# Patient Record
Sex: Male | Born: 1970 | Race: White | Hispanic: No | Marital: Married | Smoking: Never smoker
Health system: Southern US, Community
[De-identification: ages and names within clinical notes are randomized; demographics above are authoritative.]

## PROBLEM LIST (undated history)

## (undated) HISTORY — PX: ELBOW SURGERY: SHX618

## (undated) HISTORY — PX: SHOULDER SURGERY: SHX246

---

## 2001-09-17 ENCOUNTER — Other Ambulatory Visit: Admission: RE | Admit: 2001-09-17 | Discharge: 2001-09-17 | Payer: Self-pay | Admitting: Internal Medicine

## 2005-10-02 ENCOUNTER — Emergency Department (HOSPITAL_COMMUNITY): Admission: EM | Admit: 2005-10-02 | Discharge: 2005-10-03 | Payer: Self-pay | Admitting: Emergency Medicine

## 2006-04-09 ENCOUNTER — Emergency Department (HOSPITAL_COMMUNITY): Admission: EM | Admit: 2006-04-09 | Discharge: 2006-04-09 | Payer: Self-pay | Admitting: Emergency Medicine

## 2006-11-02 ENCOUNTER — Ambulatory Visit (HOSPITAL_COMMUNITY): Admission: RE | Admit: 2006-11-02 | Discharge: 2006-11-02 | Payer: Self-pay | Admitting: Unknown Physician Specialty

## 2011-04-11 ENCOUNTER — Other Ambulatory Visit (HOSPITAL_COMMUNITY): Payer: Self-pay | Admitting: Urology

## 2011-04-11 DIAGNOSIS — R109 Unspecified abdominal pain: Secondary | ICD-10-CM

## 2011-04-15 ENCOUNTER — Ambulatory Visit (HOSPITAL_COMMUNITY)
Admission: RE | Admit: 2011-04-15 | Discharge: 2011-04-15 | Disposition: A | Discharge status: Home / Self Care | Payer: BC Managed Care – PPO | Source: Ambulatory Visit | Attending: Urology | Admitting: Urology

## 2011-04-15 DIAGNOSIS — R1032 Left lower quadrant pain: Secondary | ICD-10-CM | POA: Insufficient documentation

## 2011-04-15 DIAGNOSIS — R109 Unspecified abdominal pain: Secondary | ICD-10-CM

## 2014-03-27 ENCOUNTER — Encounter (HOSPITAL_COMMUNITY): Payer: Self-pay | Admitting: Emergency Medicine

## 2014-03-27 ENCOUNTER — Emergency Department (HOSPITAL_COMMUNITY): Payer: Managed Care, Other (non HMO)

## 2014-03-27 ENCOUNTER — Emergency Department (HOSPITAL_COMMUNITY)
Admission: EM | Admit: 2014-03-27 | Discharge: 2014-03-27 | Disposition: A | Discharge status: Home / Self Care | Payer: Managed Care, Other (non HMO) | Attending: Emergency Medicine | Admitting: Emergency Medicine

## 2014-03-27 DIAGNOSIS — IMO0002 Reserved for concepts with insufficient information to code with codable children: Secondary | ICD-10-CM | POA: Insufficient documentation

## 2014-03-27 DIAGNOSIS — Z880 Allergy status to penicillin: Secondary | ICD-10-CM | POA: Insufficient documentation

## 2014-03-27 DIAGNOSIS — S61209A Unspecified open wound of unspecified finger without damage to nail, initial encounter: Secondary | ICD-10-CM | POA: Insufficient documentation

## 2014-03-27 DIAGNOSIS — S61219A Laceration without foreign body of unspecified finger without damage to nail, initial encounter: Secondary | ICD-10-CM

## 2014-03-27 DIAGNOSIS — Z23 Encounter for immunization: Secondary | ICD-10-CM | POA: Diagnosis not present

## 2014-03-27 DIAGNOSIS — Y9389 Activity, other specified: Secondary | ICD-10-CM | POA: Diagnosis not present

## 2014-03-27 DIAGNOSIS — Y9289 Other specified places as the place of occurrence of the external cause: Secondary | ICD-10-CM | POA: Insufficient documentation

## 2014-03-27 DIAGNOSIS — S62609B Fracture of unspecified phalanx of unspecified finger, initial encounter for open fracture: Secondary | ICD-10-CM

## 2014-03-27 MED ORDER — LIDOCAINE HCL (PF) 1 % IJ SOLN
INTRAMUSCULAR | Status: AC
  Administered 2014-03-27: 19:00:00
  Filled 2014-03-27: qty 5

## 2014-03-27 MED ORDER — POVIDONE-IODINE 10 % EX SOLN
CUTANEOUS | Status: AC
  Administered 2014-03-27: 19:00:00
  Filled 2014-03-27: qty 118

## 2014-03-27 MED ORDER — BUPIVACAINE HCL (PF) 0.25 % IJ SOLN
10.0000 mL | Freq: Once | INTRAMUSCULAR | Status: AC
  Administered 2014-03-27: 10 mL
  Filled 2014-03-27: qty 30

## 2014-03-27 MED ORDER — TETANUS-DIPHTH-ACELL PERTUSSIS 5-2.5-18.5 LF-MCG/0.5 IM SUSP
0.5000 mL | Freq: Once | INTRAMUSCULAR | Status: AC
  Administered 2014-03-27: 0.5 mL via INTRAMUSCULAR
  Filled 2014-03-27: qty 0.5

## 2014-03-27 MED ORDER — DOUBLE ANTIBIOTIC 500-10000 UNIT/GM EX OINT
TOPICAL_OINTMENT | Freq: Once | CUTANEOUS | Status: AC
  Administered 2014-03-27: 1 via TOPICAL
  Filled 2014-03-27: qty 1

## 2014-03-27 MED ORDER — NAPROXEN 500 MG PO TABS: 500.0000 mg | ORAL_TABLET | Freq: Two times a day (BID) | ORAL | Status: AC

## 2014-03-27 MED ORDER — LIDOCAINE HCL (PF) 1 % IJ SOLN
5.0000 mL | Freq: Once | INTRAMUSCULAR | Status: AC
  Administered 2014-03-27: 5 mL
  Filled 2014-03-27: qty 5

## 2014-03-27 MED ORDER — CEPHALEXIN 500 MG PO CAPS: 500.0000 mg | ORAL_CAPSULE | Freq: Four times a day (QID) | ORAL | Status: AC

## 2014-03-27 MED ORDER — OXYCODONE-ACETAMINOPHEN 5-325 MG PO TABS: 1.0000 | ORAL_TABLET | ORAL | Status: AC | PRN

## 2014-03-27 NOTE — ED Notes (Signed)
Pt soaking finger wound in mixture of saline and betadine per request of Kerrie Buffalo, FNP

## 2014-03-27 NOTE — Discharge Instructions (Signed)
Call Dr. Sanjuan Dame office tomorrow morning to see what time they want to see you in the afternoon.

## 2014-03-27 NOTE — ED Provider Notes (Signed)
CSN: [id number]     Arrival date & time 03/27/14  1546 History   First MD Initiated Contact with Patient 03/27/14 1704   This chart was scribed for non-physician practitioner Kerrie Buffalo, NP, working with Hurman Horn, MD by Gwenevere Abbot, ED scribe. This patient was seen in room [facility]24[facility] and the patient's care was started at 5:15 PM.    Chief Complaint  Patient presents with  . Laceration   Patient is a 43 y.o. male presenting with skin laceration. The history is provided by the patient. No language interpreter was used.  Laceration Location:  Hand Hand laceration location:  R finger Injury mechanism: door. Pain details:    Quality:  Numbness Foreign body present:  No foreign bodies  HPI Comments:  Timothy Chang is a 43 y.o. male who presents to the Emergency Department complaining of an injury to the middle right finger after accidentally shutting it in a door. Pt reports that he has a laceration and he is experiencing decreased sensation and ROM.   History reviewed. No pertinent past medical history. Past Surgical History  Procedure Laterality Date  . Elbow surgery    . Shoulder surgery     History reviewed. No pertinent family history. History  Substance Use Topics  . Smoking status: Never Smoker   . Smokeless tobacco: Not on file  . Alcohol Use: Yes    Review of Systems  Musculoskeletal:       Tender swollen right middle finger with laceration  Skin: Positive for wound.  All other systems reviewed and are negative.     Allergies  Penicillins  Home Medications   Prior to Admission medications   Not on File   BP 141/81  Pulse 64  Temp(Src) 98.6 F (37 C) (Oral)  Resp 18  Ht  (1.803 m)  Wt 192 lb (87.091 kg)  BMI 26.79 kg/m2  SpO2 100% Physical Exam  Nursing note and vitals reviewed. Constitutional: He is oriented to person, place, and time. He appears well-developed and well-nourished.  HENT:  Head: Normocephalic and atraumatic.  Eyes:  EOM are normal.  Neck: Normal range of motion. Neck supple.  Cardiovascular: Normal rate.   Pulmonary/Chest: Effort normal.  Musculoskeletal:       Left hand: He exhibits decreased range of motion, tenderness, laceration and swelling. He exhibits normal two-point discrimination and normal capillary refill.       Hands: Neurological: He is alert and oriented to person, place, and time. He has normal strength.  Good 2 point discrimination.    Skin: Skin is warm and dry.  Psychiatric: He has a normal mood and affect. His behavior is normal.    ED Course  Procedures  Dr. Fonnie Jarvis in to examine the patient.   DIAGNOSTIC STUDIES: Oxygen Saturation is 100% on RA, normal by my interpretation.  COORDINATION OF CARE: 5:18 PM-Discussed treatment plan  with pt at bedside and pt agreed to plan.   Labs Review Labs Reviewed - No data to display  Imaging Review Dg Finger Middle Left  03/27/2014   CLINICAL DATA:  Slight M2 finger an door.  Laceration.  EXAM: LEFT MIDDLE FINGER 2+V  COMPARISON:  None.  FINDINGS: Soft tissue swelling is evident within the left middle finger. There is a nondisplaced fracture in the proximal aspect of the middle phalanx. The joints are located. No radiopaque foreign body is present.  IMPRESSION: Nondisplaced fracture within the proximal aspect of the middle phalanx in the left hand.  Extensive  associated soft tissue swelling.   Electronically Signed   By: Gennette Pac M.D.   On: 03/27/2014 16:25   LACERATION REPAIR Performed by: Kerrie Buffalo Authorized by: Marelin Tat Consent: Verbal consent obtained. Risks and benefits: risks, benefits and alternatives were discussed Consent given by: patient Patient identity confirmed: provided demographic data Prepped and Draped in normal sterile fashion Wound explored  Laceration Location: Right middle finger  Laceration Length: 6 cm  No Foreign Bodies seen or palpated  Anesthesia: digital block using Lidocaine 1% and  Sensorcaine 0.25% without epinephrine  Anesthetic total: 4 ml  Irrigation method: syringe Amount of cleaning: standard  Skin closure: 5-0 prolene  Number of sutures: 5  Technique: interrupted  Patient tolerance: Patient tolerated the procedure well with no immediate complications.   MDM   SUBJECTIVE:  43 y.o. male sustained laceration of finger, left middle just prior to arrival. Ashby Dawes of injury: closed in heavy door.  Tetanus vaccination status reviewed: tetanus status unknown to the patient, tetanus re-vaccination  OBJECTIVE:  Patient appears well, vitals are normal. Laceration 5 cm noted.  Description: no foreign bodies, ragged edges. Neurovascular and tendon structures are intact. Bone not visualized  ASSESSMENT:  Laceration as described. Fractured finger  PLAN:  Anesthesia with 1% Lidocaine without Epinephrine. Wound cleansed, debrided of visible foreign material and necrotic tissue, and sutured. Antibiotic ointment and dressing applied.  Wound care instructions provided.  Observe for any signs of infection or other problems. Follow up with Dr. Hilda Lias tomorrow afternoon.  Follow up for suture removal in 7 days. Bacitracin ointment dressing and splint.    Dr. Hilda Lias has agreed to see pt on 03/28/2014. I personally performed the services described in this documentation, which was scribed in my presence. The recorded information has been reviewed and is accurate.      [address] [place], [place] 03/28/14 [phone]

## 2014-03-27 NOTE — ED Notes (Signed)
LMF injury -closed in door

## 2014-03-27 NOTE — ED Notes (Signed)
Pt instructed to f/u with Dr Hilda Lias. Pt stated he "wasn't going to call him". Stated he "would be all right and that they were just [place] charge him a small fortune". Encouraged pt to at least call office and talk to him. Pt stated he "would not". Pt declined ice pack as well.

## 2014-03-29 NOTE — ED Provider Notes (Signed)
Medical screening examination/treatment/procedure(s) were performed by non-physician practitioner and as supervising physician I was immediately available for consultation/collaboration.   EKG Interpretation None       [name], MD 03/29/14 2245 

## 2014-12-27 ENCOUNTER — Other Ambulatory Visit (HOSPITAL_COMMUNITY): Payer: Self-pay | Admitting: Family Medicine

## 2014-12-27 ENCOUNTER — Ambulatory Visit (HOSPITAL_COMMUNITY)
Admission: RE | Admit: 2014-12-27 | Discharge: 2014-12-27 | Disposition: A | Discharge status: Home / Self Care | Payer: Managed Care, Other (non HMO) | Source: Ambulatory Visit | Attending: Family Medicine | Admitting: Family Medicine

## 2014-12-27 ENCOUNTER — Ambulatory Visit (HOSPITAL_COMMUNITY): Payer: Managed Care, Other (non HMO)

## 2014-12-27 DIAGNOSIS — M79641 Pain in right hand: Secondary | ICD-10-CM

## 2014-12-27 DIAGNOSIS — M25531 Pain in right wrist: Secondary | ICD-10-CM

## 2014-12-27 DIAGNOSIS — M79644 Pain in right finger(s): Secondary | ICD-10-CM

## 2016-03-09 IMAGING — CR DG HAND COMPLETE 3+V*R*
3 series · 3 of 3 positions shown · non-contrast
Comparison: None.

CLINICAL DATA: Heavy object fell on hand yesterday. Pain and
swelling

EXAM:
RIGHT HAND - COMPLETE 3+ VIEW

[view not recorded (1 of 3)]
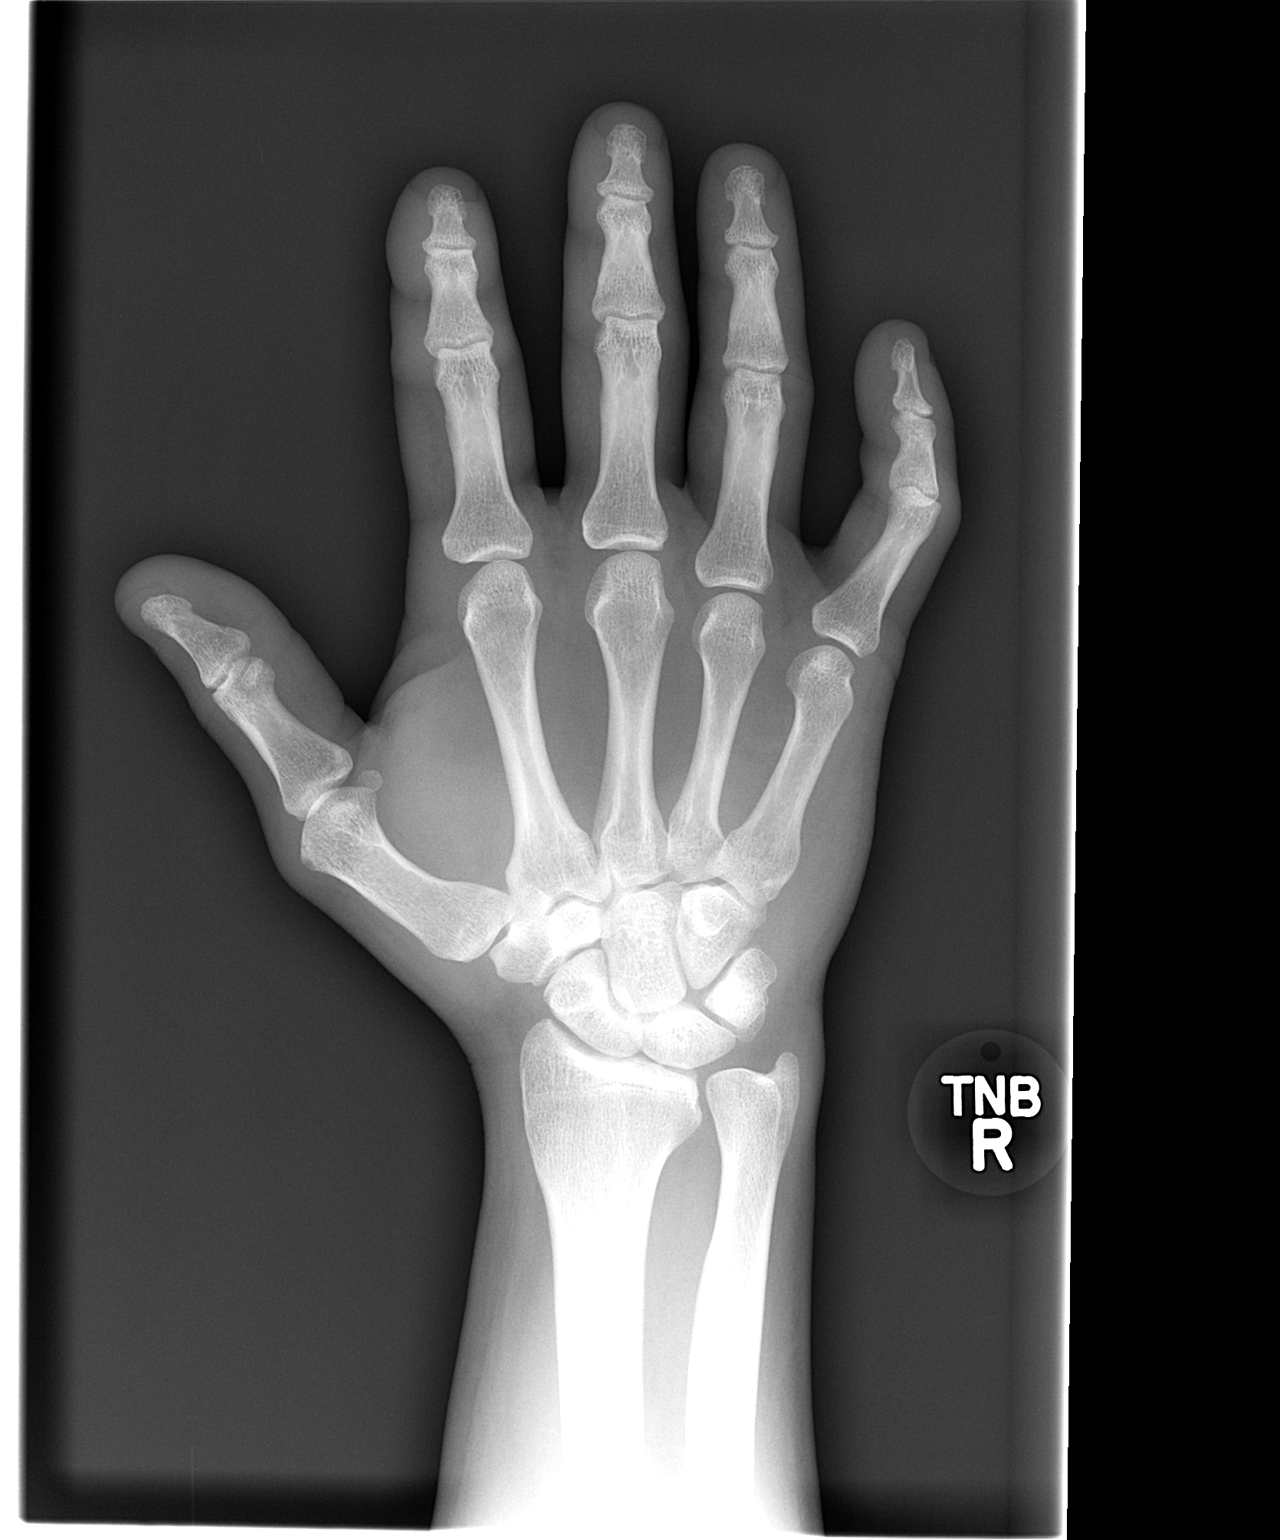

[view not recorded (2 of 3)]
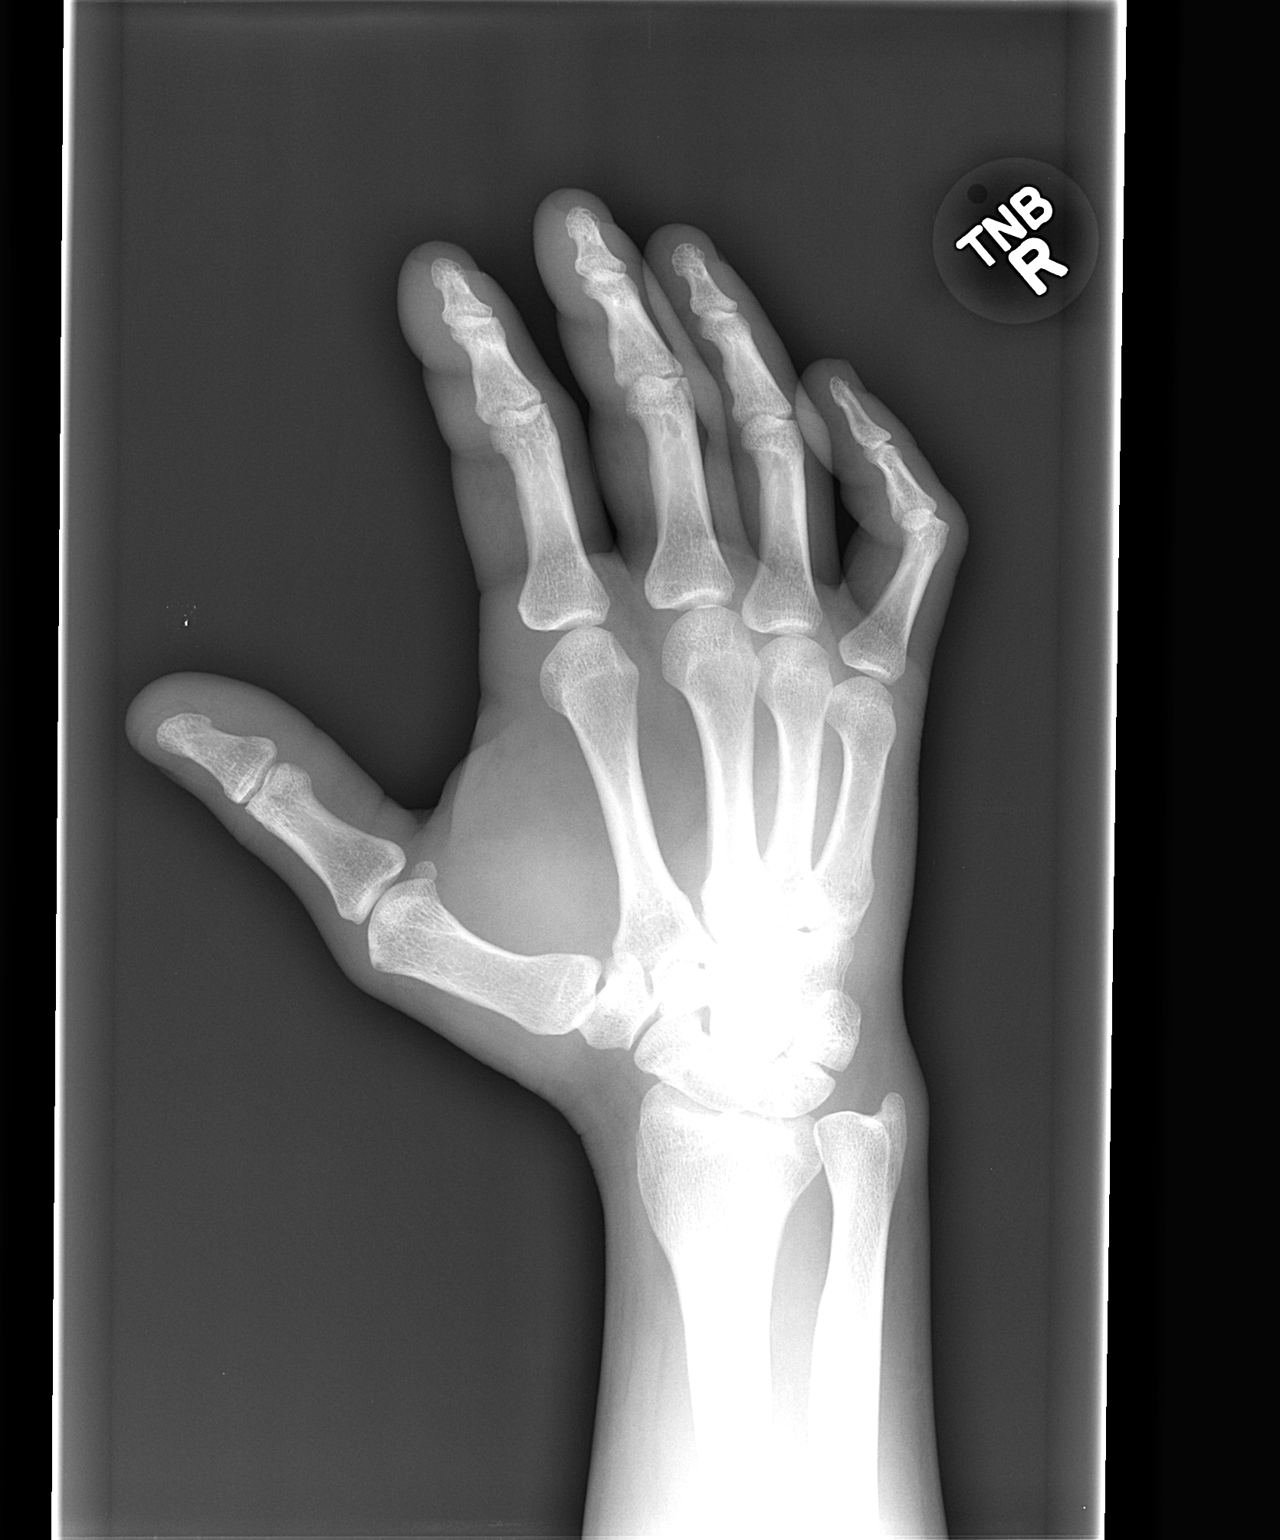

[view not recorded (3 of 3)]
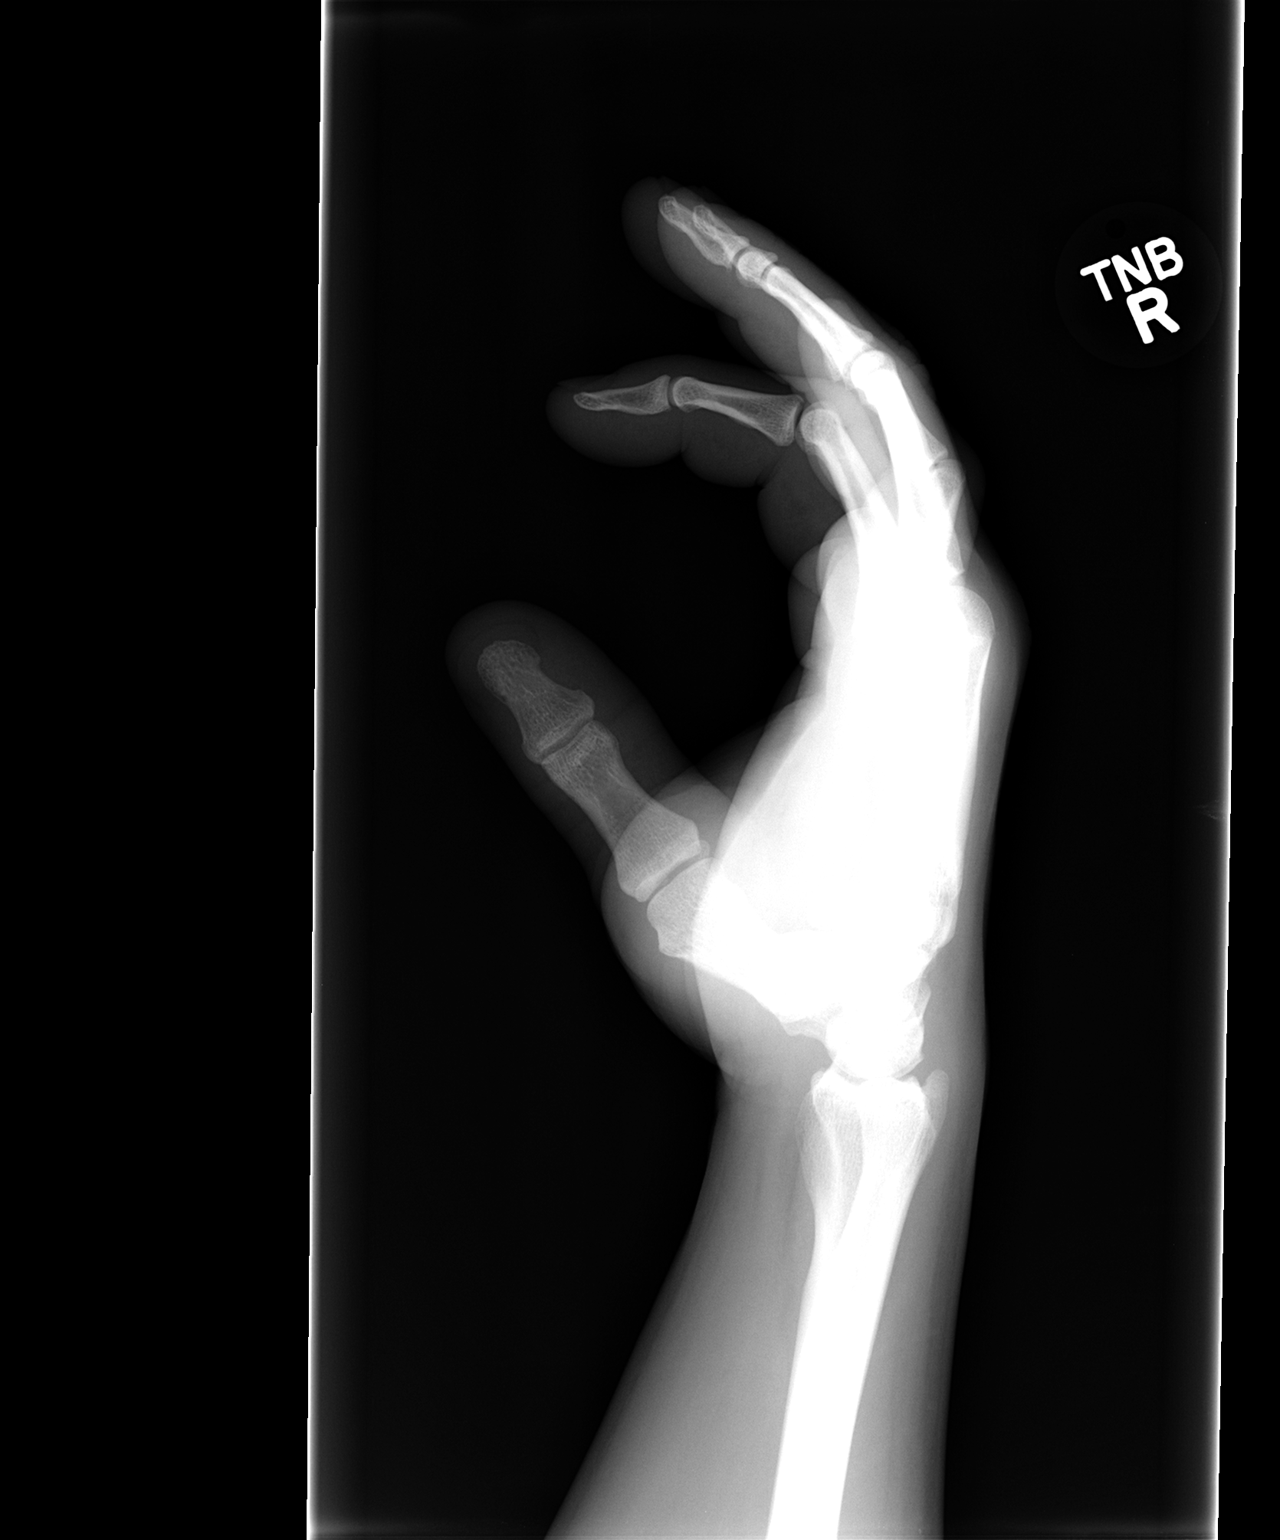

[3 of 3 positions shown; findings below may reference images not displayed]

FINDINGS: There is no evidence of fracture or dislocation. There is no
evidence of arthropathy or other focal bone abnormality. Soft
tissues are unremarkable.
IMPRESSION: Negative.
